# Patient Record
Sex: Male | Born: 1970 | Race: Black or African American | Hispanic: No | Marital: Married | Smoking: Current every day smoker
Health system: Southern US, Community
[De-identification: ages and names within clinical notes are randomized; demographics above are authoritative.]

## PROBLEM LIST (undated history)

## (undated) DIAGNOSIS — S3992XA Unspecified injury of lower back, initial encounter: Secondary | ICD-10-CM

## (undated) DIAGNOSIS — J189 Pneumonia, unspecified organism: Secondary | ICD-10-CM

---

## 2003-03-15 ENCOUNTER — Emergency Department (HOSPITAL_COMMUNITY): Admission: EM | Admit: 2003-03-15 | Discharge: 2003-03-15 | Payer: Self-pay | Admitting: Family Medicine

## 2006-12-23 ENCOUNTER — Emergency Department (HOSPITAL_COMMUNITY): Admission: EM | Admit: 2006-12-23 | Discharge: 2006-12-23 | Payer: Self-pay | Admitting: Emergency Medicine

## 2009-08-22 ENCOUNTER — Emergency Department (HOSPITAL_COMMUNITY): Admission: EM | Admit: 2009-08-22 | Discharge: 2009-08-22 | Payer: Self-pay | Admitting: Family Medicine

## 2010-03-23 LAB — POCT URINALYSIS DIPSTICK
Bilirubin Urine: NEGATIVE
Glucose, UA: NEGATIVE mg/dL
Nitrite: NEGATIVE

## 2010-04-23 ENCOUNTER — Inpatient Hospital Stay (INDEPENDENT_AMBULATORY_CARE_PROVIDER_SITE_OTHER)
Admission: RE | Admit: 2010-04-23 | Discharge: 2010-04-23 | Disposition: A | Discharge status: Home / Self Care | Payer: Self-pay | Source: Ambulatory Visit | Attending: Family Medicine | Admitting: Family Medicine

## 2010-04-23 DIAGNOSIS — K047 Periapical abscess without sinus: Secondary | ICD-10-CM

## 2010-06-17 ENCOUNTER — Inpatient Hospital Stay (INDEPENDENT_AMBULATORY_CARE_PROVIDER_SITE_OTHER)
Admission: RE | Admit: 2010-06-17 | Discharge: 2010-06-17 | Disposition: A | Discharge status: Home / Self Care | Payer: Self-pay | Source: Ambulatory Visit | Attending: Family Medicine | Admitting: Family Medicine

## 2010-06-17 DIAGNOSIS — K047 Periapical abscess without sinus: Secondary | ICD-10-CM

## 2010-11-27 ENCOUNTER — Emergency Department (HOSPITAL_COMMUNITY)
Admission: EM | Admit: 2010-11-27 | Discharge: 2010-11-27 | Disposition: A | Discharge status: Home / Self Care | Payer: Self-pay | Attending: Emergency Medicine | Admitting: Emergency Medicine

## 2010-11-27 ENCOUNTER — Emergency Department (HOSPITAL_COMMUNITY): Payer: Self-pay

## 2010-11-27 ENCOUNTER — Encounter: Payer: Self-pay | Admitting: Emergency Medicine

## 2010-11-27 DIAGNOSIS — M791 Myalgia, unspecified site: Secondary | ICD-10-CM

## 2010-11-27 DIAGNOSIS — J189 Pneumonia, unspecified organism: Secondary | ICD-10-CM | POA: Insufficient documentation

## 2010-11-27 DIAGNOSIS — R05 Cough: Secondary | ICD-10-CM | POA: Insufficient documentation

## 2010-11-27 DIAGNOSIS — R509 Fever, unspecified: Secondary | ICD-10-CM | POA: Insufficient documentation

## 2010-11-27 DIAGNOSIS — R059 Cough, unspecified: Secondary | ICD-10-CM | POA: Insufficient documentation

## 2010-11-27 DIAGNOSIS — R61 Generalized hyperhidrosis: Secondary | ICD-10-CM | POA: Insufficient documentation

## 2010-11-27 DIAGNOSIS — IMO0001 Reserved for inherently not codable concepts without codable children: Secondary | ICD-10-CM | POA: Insufficient documentation

## 2010-11-27 MED ORDER — AZITHROMYCIN 250 MG PO TABS: 250.0000 mg | ORAL_TABLET | Freq: Every day | ORAL | Status: AC

## 2010-11-27 MED ORDER — AZITHROMYCIN 250 MG PO TABS
500.0000 mg | ORAL_TABLET | Freq: Once | ORAL | Status: AC
  Administered 2010-11-27: 500 mg via ORAL
  Filled 2010-11-27 (×2): qty 2

## 2010-11-27 MED ORDER — HYDROCODONE-HOMATROPINE 5-1.5 MG/5ML PO SYRP: 5.0000 mL | ORAL_SOLUTION | Freq: Four times a day (QID) | ORAL | Status: AC | PRN

## 2010-11-27 NOTE — ED Notes (Signed)
Pt states he feels like he is wheezing when he coughs, body aches and chills have been constant.  Pt states he has been taking motrin, but has been unable to take temperature

## 2010-11-27 NOTE — ED Provider Notes (Signed)
Medical screening examination/treatment/procedure(s) were performed by non-physician practitioner and as supervising physician I was immediately available for consultation/collaboration.   Shylee Durrett M Edwing Figley, DO 11/27/10 1924 

## 2010-11-27 NOTE — ED Provider Notes (Signed)
History     CSN: [id number] Arrival date & time: 11/27/2010 10:28 AM   First MD Initiated Contact with Patient 11/27/10 1032      Chief Complaint  Patient presents with  . Generalized Body Aches    (Consider location/radiation/quality/duration/timing/severity/associated sxs/prior treatment) HPI Comments: Patient states he's been having flulike symptoms for the last week.  It started off with a cough that was nonproductive and changed into a productive cough.  Then a couple days later, he began having myalgias and fevers with night sweats.  He denies currently having fevers, sore throat, or rhinorrhea.  The history is provided by the patient.    History reviewed. No pertinent past medical history.  History reviewed. No pertinent past surgical history.  Family History  Problem Relation Age of Onset  . Asthma Child     History  Substance Use Topics  . Smoking status: Current Everyday Smoker  . Smokeless tobacco: Not on file  . Alcohol Use: No      Review of Systems  Constitutional: Positive for fever, chills and fatigue.  HENT: Negative for ear pain, congestion, sore throat, rhinorrhea, sneezing, neck pain, neck stiffness, sinus pressure and tinnitus.   Eyes: Negative for visual disturbance.  Respiratory: Positive for cough and chest tightness. Negative for shortness of breath and stridor.   Cardiovascular: Negative for chest pain and palpitations.  Gastrointestinal: Negative for nausea, vomiting, abdominal pain and diarrhea.  Genitourinary: Negative for dysuria.  Musculoskeletal: Positive for myalgias.  Skin: Negative for color change and rash.  Neurological: Negative for dizziness and weakness.  Hematological: Does not bruise/bleed easily.  Psychiatric/Behavioral: Negative for confusion.  All other systems reviewed and are negative.    Allergies  Review of patient's allergies indicates no known allergies.  Home Medications   Current Outpatient Rx  Name  Route Sig Dispense Refill  . IBUPROFEN 800 MG PO TABS Oral Take 800 mg by mouth every 8 (eight) hours as needed. For pain or fever       BP 130/66  Pulse 89  Temp(Src) 98.7 F (37.1 C) (Oral)  Resp 20  SpO2 98%  Physical Exam  Constitutional: He is oriented to person, place, and time. He appears well-nourished. No distress.  HENT:  Head: Normocephalic and atraumatic. No trismus in the jaw.  Right Ear: External ear normal. No drainage or tenderness. No mastoid tenderness.  Left Ear: External ear normal. No drainage or tenderness. No mastoid tenderness.  Nose: Nose normal. No rhinorrhea or sinus tenderness.  Mouth/Throat: Uvula is midline, oropharynx is clear and moist and mucous membranes are normal. No uvula swelling. No oropharyngeal exudate.  Eyes: Conjunctivae and EOM are normal. Right eye exhibits no discharge. Left eye exhibits no discharge. No scleral icterus.  Neck: Normal range of motion. Neck supple.  Cardiovascular: Normal rate, regular rhythm and normal heart sounds.   Pulmonary/Chest: Effort normal and breath sounds normal. No stridor. No respiratory distress. He has no wheezes. He exhibits tenderness.  Abdominal: Soft. There is no tenderness.  Musculoskeletal: Normal range of motion.  Neurological: He is alert and oriented to person, place, and time.  Skin: Skin is warm and dry. No rash noted. He is not diaphoretic.  Psychiatric: He has a normal mood and affect. His behavior is normal.    ED Course  Procedures (including critical care time)  Labs Reviewed - No data to display Dg Chest 2 View  11/27/2010  *RADIOLOGY REPORT*  Clinical Data: Cough and fever.  CHEST - 2 VIEW  Comparison: None.  Findings: There is asymmetric density in the left lower lung potentially in both the lingula and lower lobe.  This is suspicious for pneumonia.  Part of this density may also represent areas of mucous plugging and atelectasis.  No edema or pleural effusion. Heart size and  mediastinal contours are within normal limits.  IMPRESSION: Patchy density in the left lung suspicious for pneumonia and likely involving both the lingula and lower lobe.  Original Report Authenticated By: Reola Calkins, M.D.     No diagnosis found.    MDM  Myalgias Cough Pneumonia        Jaci Carrel, [place] 11/27/10 1140

## 2010-11-27 NOTE — ED Notes (Signed)
Body aches and flu s/s x 1 week fever also

## 2011-03-20 ENCOUNTER — Encounter (HOSPITAL_COMMUNITY): Payer: Self-pay | Admitting: Emergency Medicine

## 2011-03-20 ENCOUNTER — Emergency Department (INDEPENDENT_AMBULATORY_CARE_PROVIDER_SITE_OTHER)
Admission: EM | Admit: 2011-03-20 | Discharge: 2011-03-20 | Disposition: A | Discharge status: Home Health | Payer: Self-pay | Source: Home / Self Care | Attending: Emergency Medicine | Admitting: Emergency Medicine

## 2011-03-20 DIAGNOSIS — M545 Low back pain: Secondary | ICD-10-CM

## 2011-03-20 DIAGNOSIS — R319 Hematuria, unspecified: Secondary | ICD-10-CM

## 2011-03-20 HISTORY — DX: Pneumonia, unspecified organism: J18.9

## 2011-03-20 HISTORY — DX: Unspecified injury of lower back, initial encounter: S39.92XA

## 2011-03-20 LAB — POCT URINALYSIS DIP (DEVICE)
Bilirubin Urine: NEGATIVE
Ketones, ur: NEGATIVE mg/dL
Leukocytes, UA: NEGATIVE
Nitrite: NEGATIVE

## 2011-03-20 MED ORDER — METAXALONE 800 MG PO TABS: 800.0000 mg | ORAL_TABLET | Freq: Three times a day (TID) | ORAL | Status: DC

## 2011-03-20 MED ORDER — HYDROCODONE-ACETAMINOPHEN 5-325 MG PO TABS: 2.0000 | ORAL_TABLET | ORAL | Status: DC | PRN

## 2011-03-20 MED ORDER — IBUPROFEN 800 MG PO TABS: 800.0000 mg | ORAL_TABLET | Freq: Three times a day (TID) | ORAL | Status: AC | PRN

## 2011-03-20 NOTE — ED Notes (Signed)
PT HERE WITH RIGHT FLANK PAIN RADIATING DOWN TO LOWER BACK AND LEG INTERMITT  X 3 DYS AGO.DENIES N/V OR FEVERS.PAIN IS DULL/ACHY

## 2011-03-20 NOTE — Discharge Instructions (Signed)
Take the medication as written. Take 1 gram of tylenol with the motrin up to 4 times a day as needed for pain and fever. This is an effective combination for pain. Take the hydrocodone/norco only for severe pain. Do not take the tylenol and hydrocodone/norco as they both have tylenol in them and too much can hurt your liver. Return if you get worse, have a  fever >100.4, or for any concerns.   Go to www.goodrx.com to look up your medications. This will give you a list of where you can find your prescriptions at the most affordable prices.    RESOURCE GUIDE  Chronic Pain Problems Contact [facility]  [phone] Patients need to be referred by their primary care doctor.  Insufficient Money for Medicine Contact [employer]:  call "211" or [facility] [phone].  No Primary Care Doctor Call [facility]  [phone] Other agencies that provide inexpensive medical care    [facility]  [phone]    [facility]  [phone]    [facility]  [phone] [facility] [phone]   [address], [place] [place], [place] [address]   [facility]  [facility]     [employer]                          [facility] [address] [place]                       [address]      [address] [place] [address]   [phone] (After Hours)

## 2011-03-20 NOTE — ED Provider Notes (Signed)
History     CSN: [id number]  Arrival date & time 03/20/11  9147   First MD Initiated Contact with Patient 03/20/11 1021      Chief Complaint  Patient presents with  . Flank Pain  . Back Pain    (Consider location/radiation/quality/duration/timing/severity/associated sxs/prior treatment) HPI Comments: Patient reports 3 days of intermittent, dull, achy, right lower back pain that occasionally radiates down his right leg. Pain lasts for approximately 20 seconds-a minute, is worse with bending forward, torso rotation. No flank pain, abdominal pain. No hematuria, urgency, frequency, cloudy or oderous urine. No nausea, vomiting, fevers. Has been taking 800 mg of ibuprofen with some relief. No history of kidney stones. Patient has remote history of back injury, and gets pain in this area from "time to time". Patient moves furniture for a living, but does not recall any recent specific injuries. No unintentional weight loss, prolonged steroid use, history of IVDU.  ROS as noted in HPI. All other ROS negative.   Patient is a 41 y.o. male presenting with back pain. The history is provided by the patient. No language interpreter was used.  Back Pain  This is a recurrent problem. The current episode started more than 2 days ago. The problem has not changed since onset.The pain is associated with lifting heavy objects. The pain is present in the lumbar spine. The quality of the pain is described as aching. The symptoms are aggravated by bending, certain positions and twisting. The pain is worse during the day. Pertinent negatives include no chest pain, no fever, no numbness, no abdominal pain, no abdominal swelling, no bowel incontinence, no perianal numbness, no bladder incontinence, no dysuria, no pelvic pain, no paresthesias, no paresis and no weakness. He has tried NSAIDs for the symptoms. The treatment provided mild relief.    Past Medical History  Diagnosis Date  . PNA (pneumonia)   . Lower  back injury     History reviewed. No pertinent past surgical history.  Family History  Problem Relation Age of Onset  . Asthma Child     History  Substance Use Topics  . Smoking status: Current Everyday Smoker  . Smokeless tobacco: Not on file  . Alcohol Use: No      Review of Systems  Constitutional: Negative for fever.  Cardiovascular: Negative for chest pain.  Gastrointestinal: Negative for abdominal pain and bowel incontinence.  Genitourinary: Negative for bladder incontinence, dysuria and pelvic pain.  Musculoskeletal: Positive for back pain.  Neurological: Negative for weakness, numbness and paresthesias.    Allergies  Review of patient's allergies indicates no known allergies.  Home Medications   Current Outpatient Rx  Name Route Sig Dispense Refill  . HYDROCODONE-ACETAMINOPHEN 5-325 MG PO TABS Oral Take 2 tablets by mouth every 4 (four) hours as needed for pain. 20 tablet 0  . IBUPROFEN 800 MG PO TABS Oral Take 1 tablet (800 mg total) by mouth every 8 (eight) hours as needed for pain. For pain or fever 20 tablet 0  . METAXALONE 800 MG PO TABS Oral Take 1 tablet (800 mg total) by mouth 3 (three) times daily. 21 tablet 0    BP 131/72  Pulse 68  Temp(Src) 99.4 F (37.4 C) (Oral)  Resp 14  SpO2 100%  Physical Exam  Nursing note and vitals reviewed. Constitutional: He is oriented to person, place, and time. He appears well-developed and well-nourished.  HENT:  Head: Normocephalic and atraumatic.  Eyes: Conjunctivae and EOM are normal.  Neck: Normal range of  motion.  Cardiovascular: Normal rate, regular rhythm, normal heart sounds and intact distal pulses.   Pulmonary/Chest: Effort normal. No respiratory distress.  Abdominal: Soft. Bowel sounds are normal. He exhibits no distension. There is no tenderness. There is no rebound and no guarding.  Musculoskeletal: Normal range of motion. He exhibits no edema.       Right paralumbar tenderness. Bilateral lower  extremities nontender, baseline ROM with intact PT pulses.. No pain with PROM hips bilaterally. SLR neg bilaterally. Sensation baseline light touch bilaterally for Pt, DTR's symmetric and intact bilaterally KJ,  Motor symmetric bilateral 5/5 hip flexion, quadriceps, hamstrings, EHL, foot dorsiflexion, foot plantarflexion, gait normal.    Neurological: He is alert and oriented to person, place, and time.  Skin: Skin is warm and dry.  Psychiatric: He has a normal mood and affect. His behavior is normal.    ED Course  Procedures (including critical care time)  Labs Reviewed  POCT URINALYSIS DIP (DEVICE) - Abnormal; Notable for the following:    Hgb urine dipstick SMALL (*)    Protein, ur 30 (*)    All other components within normal limits   No results found.   1. Low back pain   2. Hematuria      Results for orders placed during the hospital encounter of 03/20/11  POCT URINALYSIS DIP (DEVICE)      Component Value Range   Glucose, UA NEGATIVE  NEGATIVE (mg/dL)   Bilirubin Urine NEGATIVE  NEGATIVE    Ketones, ur NEGATIVE  NEGATIVE (mg/dL)   Specific Gravity, Urine 1.025  1.005 - 1.030    Hgb urine dipstick SMALL (*) NEGATIVE    pH 6.5  5.0 - 8.0    Protein, ur 30 (*) NEGATIVE (mg/dL)   Urobilinogen, UA 1.0  0.0 - 1.0 (mg/dL)   Nitrite NEGATIVE  NEGATIVE    Leukocytes, UA NEGATIVE  NEGATIVE     MDM  Previous charts reviewed. Additional medical history obtained. As noted in history of present illness. Patient was seen here previously in 2001 for similar presentation. Had trace hematuria at that time. Discussed today's hematuria with patient. No urinary complaints. The history and physical is more consistent with musculoskeletal cause of his pain, rather than a kidney stone. Advised him of the risk of bladder cancer, especially with his smoking. The patient will follow up with PMD of his choice.  Domenick Gong, MD 03/20/11 1137

## 2011-03-22 ENCOUNTER — Emergency Department (HOSPITAL_COMMUNITY)
Admission: EM | Admit: 2011-03-22 | Discharge: 2011-03-22 | Disposition: A | Discharge status: Home / Self Care | Payer: Self-pay | Attending: Emergency Medicine | Admitting: Emergency Medicine

## 2011-03-22 ENCOUNTER — Encounter (HOSPITAL_COMMUNITY): Payer: Self-pay | Admitting: Emergency Medicine

## 2011-03-22 DIAGNOSIS — F17200 Nicotine dependence, unspecified, uncomplicated: Secondary | ICD-10-CM | POA: Insufficient documentation

## 2011-03-22 DIAGNOSIS — M549 Dorsalgia, unspecified: Secondary | ICD-10-CM | POA: Insufficient documentation

## 2011-03-22 LAB — URINALYSIS, ROUTINE W REFLEX MICROSCOPIC
Ketones, ur: NEGATIVE mg/dL
Leukocytes, UA: NEGATIVE
Nitrite: NEGATIVE
Specific Gravity, Urine: 1.026 (ref 1.005–1.030)
Urobilinogen, UA: 1 mg/dL (ref 0.0–1.0)
pH: 8 (ref 5.0–8.0)

## 2011-03-22 LAB — URINE MICROSCOPIC-ADD ON

## 2011-03-22 NOTE — ED Provider Notes (Signed)
History     CSN: [id number]  Arrival date & time 03/22/11  1432   First MD Initiated Contact with Patient 03/22/11 1506      Chief Complaint  Patient presents with  . Back Pain    (Consider location/radiation/quality/duration/timing/severity/associated sxs/prior treatment) HPI History provided by pt and prior chart.  Pt presents w/ right side and low back pain w/ radiation down entire right leg x 1 week.   Pain waxes and wanes but severity can be as great as 6/10 on pain scale.  No modifying factors.  Has taken skelaxin and ibuprofen w/out relief.  Denies fever, bladder/bowel dysfunction and lower extremity weakness/parasthesias.   No known trauma, h/o kidney stone or h/o chronic low back pain.  Per prior chart, pt seen at [facility] for same 2 days ago.  Urine dip stick showed small amt of hemoglobin but was negative for infection.  Pain thought to be musculoskeletal and pt d/c'd home w/ pain medication.    Past Medical History  Diagnosis Date  . PNA (pneumonia)   . Lower back injury     History reviewed. No pertinent past surgical history.  Family History  Problem Relation Age of Onset  . Asthma Child     History  Substance Use Topics  . Smoking status: Current Everyday Smoker  . Smokeless tobacco: Not on file  . Alcohol Use: No      Review of Systems  All other systems reviewed and are negative.    Allergies  Review of patient's allergies indicates no known allergies.  Home Medications   Current Outpatient Rx  Name Route Sig Dispense Refill  . IBUPROFEN 800 MG PO TABS Oral Take 1 tablet (800 mg total) by mouth every 8 (eight) hours as needed for pain. For pain or fever 20 tablet 0    BP 116/63  Pulse 70  Temp 98.7 F (37.1 C)  Resp 16  SpO2 99%  Physical Exam  Nursing note and vitals reviewed. Constitutional: He is oriented to person, place, and time. He appears well-developed and well-nourished.  HENT:  Head: Normocephalic and atraumatic.  Eyes:   Normal appearance  Neck: Normal range of motion.  Cardiovascular: Normal rate and regular rhythm.   Pulmonary/Chest: Effort normal and breath sounds normal.  Abdominal: Soft. Bowel sounds are normal. He exhibits no distension.       Mild tenderness of right side only.  No skin changes in this location.  Musculoskeletal:       Lumbar spinal and paraspinal non-tender. Mild R CVA ttp. Full ROM of LE.  Nml patellar reflexes.  No saddle anesthesia. Distal sensation intact.  2+ DP pulses.  Ambulates w/out diffulty.   Neurological: He is alert and oriented to person, place, and time.  Skin: Skin is warm and dry. No rash noted.  Psychiatric: He has a normal mood and affect. His behavior is normal.    ED Course  Procedures (including critical care time)  Labs Reviewed  URINALYSIS, ROUTINE W REFLEX MICROSCOPIC - Abnormal; Notable for the following:    APPearance TURBID (*)    All other components within normal limits  URINE MICROSCOPIC-ADD ON   No results found.   1. Back pain       MDM  Healthy 41yo M presents w/ non-traumatic right side pain w/ radiation down RLE x 1 week.  No associated sx.  No back pain red flag s/sx.  Pt does not appear to be uncomfortable and U/A w/out infection or hematuria.  Doubt kidney stone.  Pain likely muscular or radicular.  Discussed this w/ pt.  He has a prescription for ibuprofen, skelaxin and hydrocodone at home.  Recommended ice and rest and refer to ortho for persistent sx.  Return precautions discussed.        Otilio Miu, [place] 03/22/11 1726

## 2011-03-22 NOTE — ED Notes (Signed)
Was [facility] few [facility] and was told he had  A little blood in ua has not been able to get into his dr

## 2011-03-22 NOTE — Discharge Instructions (Signed)
Take your vicodin and skelaxin as prescribed.  Do not drive within four hours of taking these medications (may cause drowsiness or confusion).  Take ibuprofen w/ food up to three times a day, as well.  Apply ice to painful areas 2-3 times a day.  Avoid activities that aggravate pain.  Follow up w/ Dr. Rennis Chris if pain has not started to improve by Monday.  Back Exercises Back exercises help treat and prevent back injuries. The goal of back exercises is to increase the strength of your abdominal and back muscles and the flexibility of your back. These exercises should be started when you no longer have back pain. Back exercises include:  Pelvic Tilt. Lie on your back with your knees bent. Tilt your pelvis until the lower part of your back is against the floor. Hold this position 5 to 10 sec and repeat 5 to 10 times.   Knee to Chest. Pull first 1 knee up against your chest and hold for 20 to 30 seconds, repeat this with the other knee, and then both knees. This may be done with the other leg straight or bent, whichever feels better.   Sit-Ups or Curl-Ups. Bend your knees 90 degrees. Start with tilting your pelvis, and do a partial, slow sit-up, lifting your trunk only 30 to 45 degrees off the floor. Take at least 2 to 3 seconds for each sit-up. Do not do sit-ups with your knees out straight. If partial sit-ups are difficult, simply do the above but with only tightening your abdominal muscles and holding it as directed.   Hip-Lift. Lie on your back with your knees flexed 90 degrees. Push down with your feet and shoulders as you raise your hips a couple inches off the floor; hold for 10 seconds, repeat 5 to 10 times.   Back arches. Lie on your stomach, propping yourself up on bent elbows. Slowly press on your hands, causing an arch in your low back. Repeat 3 to 5 times. Any initial stiffness and discomfort should lessen with repetition over time.   Shoulder-Lifts. Lie face down with arms beside your body.  Keep hips and torso pressed to floor as you slowly lift your head and shoulders off the floor.  Do not overdo your exercises, especially in the beginning. Exercises may cause you some mild back discomfort which lasts for a few minutes; however, if the pain is more severe, or lasts for more than 15 minutes, do not continue exercises until you see your caregiver. Improvement with exercise therapy for back problems is slow.  See your caregivers for assistance with developing a proper back exercise program. Document Released: 02/01/2004 Document Revised: 12/13/2010 Document Reviewed: 12/24/2004 [facility] Patient Information 2012 [place], [place].Call [facility] [phone]) if you do not have a primary care doctor and would like assistance with finding one.   You should return to the ER if you develop fever, leg weakness or loss of control of bladder/bowels.

## 2011-03-23 NOTE — ED Provider Notes (Signed)
Medical screening examination/treatment/procedure(s) were performed by non-physician practitioner and as supervising physician I was immediately available for consultation/collaboration.  Flint Melter, MD 03/23/11 [phone]

## 2013-04-14 ENCOUNTER — Encounter (HOSPITAL_COMMUNITY): Payer: Self-pay | Admitting: Emergency Medicine

## 2013-04-14 ENCOUNTER — Emergency Department (HOSPITAL_COMMUNITY)
Admission: EM | Admit: 2013-04-14 | Discharge: 2013-04-14 | Disposition: A | Discharge status: Home / Self Care | Payer: BC Managed Care – PPO | Source: Home / Self Care | Attending: Emergency Medicine | Admitting: Emergency Medicine

## 2013-04-14 DIAGNOSIS — J00 Acute nasopharyngitis [common cold]: Secondary | ICD-10-CM

## 2013-04-14 NOTE — Discharge Instructions (Signed)
You have a common cold. Plenty of fluids, tylenol as directed on packaging for aches/pain. Delsym as directed on packaging for cough. Try to stop smoking.   Upper Respiratory Infection, Adult An upper respiratory infection (URI) is also sometimes known as the common cold. The upper respiratory tract includes the nose, sinuses, throat, trachea, and bronchi. Bronchi are the airways leading to the lungs. Most people improve within 1 week, but symptoms can last up to 2 weeks. A residual cough may last even longer.  CAUSES Many different viruses can infect the tissues lining the upper respiratory tract. The tissues become irritated and inflamed and often become very moist. Mucus production is also common. A cold is contagious. You can easily spread the virus to others by oral contact. This includes kissing, sharing a glass, coughing, or sneezing. Touching your mouth or nose and then touching a surface, which is then touched by another person, can also spread the virus. SYMPTOMS  Symptoms typically develop 1 to 3 days after you come in contact with a cold virus. Symptoms vary from person to person. They may include:  Runny nose.  Sneezing.  Nasal congestion.  Sinus irritation.  Sore throat.  Loss of voice (laryngitis).  Cough.  Fatigue.  Muscle aches.  Loss of appetite.  Headache.  Low-grade fever. DIAGNOSIS  You might diagnose your own cold based on familiar symptoms, since most people get a cold 2 to 3 times a year. Your caregiver can confirm this based on your exam. Most importantly, your caregiver can check that your symptoms are not due to another disease such as strep throat, sinusitis, pneumonia, asthma, or epiglottitis. Blood tests, throat tests, and X-rays are not necessary to diagnose a common cold, but they may sometimes be helpful in excluding other more serious diseases. Your caregiver will decide if any further tests are required. RISKS AND COMPLICATIONS  You may be at risk  for a more severe case of the common cold if you smoke cigarettes, have chronic heart disease (such as heart failure) or lung disease (such as asthma), or if you have a weakened immune system. The very young and very old are also at risk for more serious infections. Bacterial sinusitis, middle ear infections, and bacterial pneumonia can complicate the common cold. The common cold can worsen asthma and chronic obstructive pulmonary disease (COPD). Sometimes, these complications can require emergency medical care and may be life-threatening. PREVENTION  The best way to protect against getting a cold is to practice good hygiene. Avoid oral or hand contact with people with cold symptoms. Wash your hands often if contact occurs. There is no clear evidence that vitamin C, vitamin E, echinacea, or exercise reduces the chance of developing a cold. However, it is always recommended to get plenty of rest and practice good nutrition. TREATMENT  Treatment is directed at relieving symptoms. There is no cure. Antibiotics are not effective, because the infection is caused by a virus, not by bacteria. Treatment may include:  Increased fluid intake. Sports drinks offer valuable electrolytes, sugars, and fluids.  Breathing heated mist or steam (vaporizer or shower).  Eating chicken soup or other clear broths, and maintaining good nutrition.  Getting plenty of rest.  Using gargles or lozenges for comfort.  Controlling fevers with ibuprofen or acetaminophen as directed by your caregiver.  Increasing usage of your inhaler if you have asthma. Zinc gel and zinc lozenges, taken in the first 24 hours of the common cold, can shorten the duration and lessen the severity  of symptoms. Pain medicines may help with fever, muscle aches, and throat pain. A variety of non-prescription medicines are available to treat congestion and runny nose. Your caregiver can make recommendations and may suggest nasal or lung inhalers for other  symptoms.  HOME CARE INSTRUCTIONS   Only take over-the-counter or prescription medicines for pain, discomfort, or fever as directed by your caregiver.  Use a warm mist humidifier or inhale steam from a shower to increase air moisture. This may keep secretions moist and make it easier to breathe.  Drink enough water and fluids to keep your urine clear or pale yellow.  Rest as needed.  Return to work when your temperature has returned to normal or as your caregiver advises. You may need to stay home longer to avoid infecting others. You can also use a face mask and careful hand washing to prevent spread of the virus. SEEK MEDICAL CARE IF:   After the first few days, you feel you are getting worse rather than better.  You need your caregiver's advice about medicines to control symptoms.  You develop chills, worsening shortness of breath, or brown or red sputum. These may be signs of pneumonia.  You develop yellow or brown nasal discharge or pain in the face, especially when you bend forward. These may be signs of sinusitis.  You develop a fever, swollen neck glands, pain with swallowing, or white areas in the back of your throat. These may be signs of strep throat. SEEK IMMEDIATE MEDICAL CARE IF:   You have a fever.  You develop severe or persistent headache, ear pain, sinus pain, or chest pain.  You develop wheezing, a prolonged cough, cough up blood, or have a change in your usual mucus (if you have chronic lung disease).  You develop sore muscles or a stiff neck. Document Released: 06/19/2000 Document Revised: 03/18/2011 Document Reviewed: 04/27/2010 [facility] Patient Information 2014 [place], [place].

## 2013-04-14 NOTE — ED Notes (Signed)
Pt reports  Symptoms  Of  Cough  /  Congested   Stuffy  Nose   And    shorttness  Of  bnreath   When he   Takes  A  Deep  Breath         He reports    His  Throat  Feels  scrathchy          Pt  Is  Sitting  Upright on the  Exam table  Speaking in  Complete  sentances  And    Appears  In no acute  Distress

## 2013-04-14 NOTE — ED Provider Notes (Signed)
CSN: [id number]     Arrival date & time 04/14/13  1002 History   First MD Initiated Contact with Patient 04/14/13 1105     Chief Complaint  Patient presents with  . URI   (Consider location/radiation/quality/duration/timing/severity/associated sxs/prior Treatment) HPI Comments: No chest pain or fever.  Is a smoker PCP: none Works at [employer]  Patient is a 43 y.o. male presenting with URI. The history is provided by the patient.  URI Presenting symptoms: congestion, cough and rhinorrhea   Presenting symptoms: no fever and no sore throat   Severity:  Mild Onset quality:  Gradual Duration:  3 days Timing:  Constant Progression:  Unchanged Chronicity:  New Associated symptoms: no arthralgias, no headaches, no myalgias, no neck pain, no sinus pain, no sneezing, no swollen glands and no wheezing     Past Medical History  Diagnosis Date  . PNA (pneumonia)   . Lower back injury    History reviewed. No pertinent past surgical history. Family History  Problem Relation Age of Onset  . Asthma Child    History  Substance Use Topics  . Smoking status: Current Every Day Smoker  . Smokeless tobacco: Not on file  . Alcohol Use: No    Review of Systems  Constitutional: Negative for fever.  HENT: Positive for congestion and rhinorrhea. Negative for sneezing and sore throat.   Respiratory: Positive for cough. Negative for wheezing.   Musculoskeletal: Negative for arthralgias, myalgias and neck pain.  Neurological: Negative for headaches.  All other systems reviewed and are negative.   Allergies  Review of patient's allergies indicates no known allergies.  Home Medications   Current Outpatient Rx  Name  Route  Sig  Dispense  Refill  . ibuprofen (ADVIL,MOTRIN) 800 MG tablet   Oral   Take 1 tablet (800 mg total) by mouth every 8 (eight) hours as needed for pain. For pain or fever   20 tablet   0    BP 120/69  Pulse 75  Temp(Src) 98.5 F (36.9 C) (Oral)  Resp 16  SpO2  100% Physical Exam  Nursing note and vitals reviewed. Constitutional: He is oriented to person, place, and time. He appears well-developed and well-nourished. No distress.  HENT:  Head: Normocephalic and atraumatic.  Right Ear: Hearing, tympanic membrane, external ear and ear canal normal.  Left Ear: Hearing, tympanic membrane, external ear and ear canal normal.  Nose: Nose normal.  Mouth/Throat: Uvula is midline, oropharynx is clear and moist and mucous membranes are normal.  Eyes: Conjunctivae are normal. No scleral icterus.  Neck: Normal range of motion. Neck supple.  Cardiovascular: Normal rate, regular rhythm and normal heart sounds.   Pulmonary/Chest: Effort normal and breath sounds normal.  Abdominal: Soft. Bowel sounds are normal. He exhibits no distension. There is no tenderness.  Musculoskeletal: Normal range of motion.  Lymphadenopathy:    He has no cervical adenopathy.  Neurological: He is alert and oriented to person, place, and time.  Skin: Skin is warm and dry. No rash noted.  Psychiatric: He has a normal mood and affect. His behavior is normal.    ED Course  Procedures (including critical care time) Labs Review Labs Reviewed - No data to display Imaging Review No results found.   MDM   1. Common cold    Patient appears well. Will advise regarding symptomatic care at home or self limited common cold. Encouraged to stop smoking.   Jess BartersJennifer Lee GreshamPresson, [place] 04/14/13 1124

## 2013-04-15 ENCOUNTER — Emergency Department (HOSPITAL_COMMUNITY)
Admission: EM | Admit: 2013-04-15 | Discharge: 2013-04-15 | Disposition: A | Discharge status: Home / Self Care | Payer: BC Managed Care – PPO | Attending: Emergency Medicine | Admitting: Emergency Medicine

## 2013-04-15 ENCOUNTER — Emergency Department (HOSPITAL_COMMUNITY): Payer: BC Managed Care – PPO

## 2013-04-15 ENCOUNTER — Encounter (HOSPITAL_COMMUNITY): Payer: Self-pay | Admitting: Emergency Medicine

## 2013-04-15 DIAGNOSIS — Z8701 Personal history of pneumonia (recurrent): Secondary | ICD-10-CM | POA: Insufficient documentation

## 2013-04-15 DIAGNOSIS — Z87828 Personal history of other (healed) physical injury and trauma: Secondary | ICD-10-CM | POA: Insufficient documentation

## 2013-04-15 DIAGNOSIS — J069 Acute upper respiratory infection, unspecified: Secondary | ICD-10-CM

## 2013-04-15 DIAGNOSIS — F17200 Nicotine dependence, unspecified, uncomplicated: Secondary | ICD-10-CM | POA: Insufficient documentation

## 2013-04-15 MED ORDER — NAPROXEN 500 MG PO TABS: 500.0000 mg | ORAL_TABLET | Freq: Two times a day (BID) | ORAL | Status: AC

## 2013-04-15 MED ORDER — BENZONATATE 100 MG PO CAPS: 100.0000 mg | ORAL_CAPSULE | Freq: Three times a day (TID) | ORAL | Status: AC

## 2013-04-15 MED ORDER — HYDROCODONE-HOMATROPINE 5-1.5 MG/5ML PO SYRP: 5.0000 mL | ORAL_SOLUTION | Freq: Four times a day (QID) | ORAL | Status: AC | PRN

## 2013-04-15 NOTE — Discharge Instructions (Signed)
Take 400-600 mg Ibuprofen (Motrin) every 6-8 hours for fever and pain  Alternate with Tylenol  Take  500 mg Tylenol every 4-6 hours as needed for fever and pain  Follow-up with your primary care provider next week for recheck of symptoms if not improving.  Be sure to drink plenty of fluids and rest, at least 8hrs of sleep a night, preferably more while you are sick. Return to the ED if you cannot keep down fluids/signs of dehydration, fever not reducing with Tylenol, difficulty breathing/wheezing, stiff neck, worsening condition, or other concerns (see below)    Cough, Adult  A cough is a reflex. It helps you clear your throat and airways. A cough can help heal your body. A cough can last 2 or 3 weeks (acute) or may last more than 8 weeks (chronic). Some common causes of a cough can include an infection, allergy, or a cold. HOME CARE  Only take medicine as told by your doctor.  If given, take your medicines (antibiotics) as told. Finish them even if you start to feel better.  Use a cold steam vaporizer or humidier in your home. This can help loosen thick spit (secretions).  Sleep so you are almost sitting up (semi-upright). Use pillows to do this. This helps reduce coughing.  Rest as needed.  Stop smoking if you smoke. GET HELP RIGHT AWAY IF:  You have yellowish-white fluid (pus) in your thick spit.  Your cough gets worse.  Your medicine does not reduce coughing, and you are losing sleep.  You cough up blood.  You have trouble breathing.  Your pain gets worse and medicine does not help.  You have a fever. MAKE SURE YOU:   Understand these instructions.  Will watch your condition.  Will get help right away if you are not doing well or get worse. Document Released: 09/06/2010 Document Revised: 03/18/2011 Document Reviewed: 09/06/2010 [facility] Patient Information 2014 [place], [place].    Emergency Department Resource Guide 1) Find a Doctor and Pay Out of  Pocket Although you won't have to find out who is covered by your insurance plan, it is a good idea to ask around and get recommendations. You will then need to call the office and see if the doctor you have chosen will accept you as a new patient and what types of options they offer for patients who are self-pay. Some doctors offer discounts or will set up payment plans for their patients who do not have insurance, but you will need to ask so you aren't surprised when you get to your appointment.  2) Contact Your Local Health Department Not all health departments have doctors that can see patients for sick visits, but many do, so it is worth a call to see if yours does. If you don't know where your local health department is, you can check in your phone book. The CDC also has a tool to help you locate your state's health department, and many state websites also have listings of all of their local health departments.  3) Find a Walk-in Clinic If your illness is not likely to be very severe or complicated, you may want to try a walk in clinic. These are popping up all over the country in pharmacies, drugstores, and shopping centers. They're usually staffed by nurse practitioners or physician assistants that have been trained to treat common illnesses and complaints. They're usually fairly quick and inexpensive. However, if you have serious medical issues or chronic medical problems, these are probably  not your best option.  No Primary Care Doctor: - Call Health Connect at  [phone] - they can help you locate a primary care doctor that  accepts your insurance, provides certain services, etc. - Physician Referral Service- [phone]  Chronic Pain Problems: Organization         Address  Phone   Notes  [facility]  [phone] Patients need to be referred by their primary care doctor.   Medication Assistance: Organization         Address  Phone   Notes  [facility]  Medication [facility] [address] [place], [place] [address] [phone] --Must be a resident of [facility] -- Must have NO insurance coverage whatsoever (no Medicaid/ Medicare, etc.) -- The pt. MUST have a primary care doctor that directs their care regularly and follows them in the community   MedAssist  [phone]   [employer]  [phone]    Agencies that provide inexpensive medical care: Organization         Address                                                       Phone                                                                            Notes  [facility]  [phone]   [facility]    [phone]   [facility] [address] [place], [place] [address] [phone]   [facility] [address], [place] [phone]   [facility]    [phone]   [facility]    [phone]   [facility]  [address], [place] Phone:  [phone], Fax:  [phone] Hours of Operation:  9 am - 6 pm, M-F.  Also accepts Medicaid/Medicare and self-pay.  [facility]  [address],  Phone: [phone], Fax: [phone]. Hours of Operation:  8:30 am - 5:30 pm, M-F.  Also accepts Medicaid and self-pay.  [facility] [place] [address], [place] Phone: [phone]   [facility] [address], [place], [place] [phone], Ext. [phone] Mondays & Thursdays: 7-9 AM.  First 15 patients are seen on a first come, first serve basis.    Medicaid-accepting [facility] Providers:  Surveyor, quantityrganization         Address                                                                       Phone  Notes  [facility] [address], [place] [phone] Also accepts self-pay patients.  [facility] [id number] [place], [place]  [phone]   [address], [address], [place] [phone]   [facility] [address] [place] [phone]   Lucianne Lei [address], [place]   [phone] Only accepts [place] Access [place] patients after they have their name applied to their card.   Self-Pay (no insurance) in [facility]:   Organization         Address                                                     Phone               Notes  Sickle Cell Patients, [facility] [place] [phone]   [facility] [place] [phone]   [facility] [place]  [place], [place], [place] [phone]   [facility]/Dr. Osei-Bonsu  [address] [place] or [address], [place] [phone] Phone number for both [place] and [place] locations is the same.  [facility]gent Medical and [facility] [address] [place] [phone]   [facility] [address] [place] or [address] [phone]   [facility] [address] [place] [phone], phone; [phone], fax Sees patients 1st and 3rd Saturday of every month.  Must not qualify for public or private insurance (i.e. Medicaid, Medicare, [place] Health Choice, Veterans' Benefits)  Household income should be no more than 200% of the poverty level The clinic cannot treat you if you are pregnant or think you are pregnant  Sexually transmitted diseases are not treated at the clinic.    Dental Care: Organization         Address                                  Phone                       Notes  [facility] Department of [facility] [phone] Accepts  children up to age 25 who are enrolled in [place] or [place]; pregnant women with a Medicaid card; and children who have applied for Medicaid or [place] Health Choice, but were declined, whose parents can pay a reduced fee at time of service.  [facility] Department of [facility]  [address], [place] [phone] Accepts children up to age 58 who are enrolled in [place] or [place]; pregnant women with a Medicaid card; and children who have applied for Medicaid or [place] Health Choice, but were declined, whose parents can pay a reduced fee at time of service.  [facility]  [place] [phone] Patients are seen by appointment only. Walk-ins are not accepted. [place] will see patients  30 years of age and older. Monday - Tuesday (8am-5pm) Most Wednesdays (8:30-5pm) $30 per visit, cash only  [facility] Adult Dental Access PROGRAM  [address], [facility] [phone] Patients are seen by appointment only. Walk-ins are not accepted. [facility] will see patients 27 years of age and older. One Wednesday Evening (Monthly: Volunteer Based).  $30 per visit, cash only  [employer]  [phone] for adults; Children under age 52, call Graduate Pediatric Dentistry at [phone]. Children aged 17-14, please call [phone] to request a pediatric application.  Dental services are provided in all areas of dental care including fillings, crowns and bridges, complete and partial dentures, implants, gum treatment, root canals, and extractions. Preventive care is also provided. Treatment is provided to both adults and children. Patients are selected via a lottery and there is often a waiting list.   [facility] [address], [place]  [phone] [email or web address]   [facility] [address] [place], [place] [phone], Ext. [phone] Second and  Fourth Thursday of each month, opens at 6:30 AM; Clinic ends at 9 AM.  Patients are seen on a first-come first-served basis, and a limited number are seen during each clinic.   [facility]  [address] [place], [place] [phone]   Eligibility Requirements You must have lived in [place], [place], or [place] counties for at least the last three months.   You cannot be eligible for state or federal sponsored [place], including [employer], [place], or [employer].   You generally cannot be eligible for healthcare insurance through your employer.    How to apply: Eligibility screenings are held every Tuesday and Wednesday afternoon from 1:00 pm until 4:00 pm. You do not need an appointment for the interview!  [facility] [address] [place], [place] [phone]   [facility] Health Department  [phone]   [facility]  [phone]   [facility] Health Department  [phone]

## 2013-04-15 NOTE — ED Provider Notes (Signed)
Medical screening examination/treatment/procedure(s) were performed by non-physician practitioner and as supervising physician I was immediately available for consultation/collaboration.  Sydny Schnitzler, M.D.  Rody Keadle C Orvill Coulthard, MD 04/15/13 0758 

## 2013-04-15 NOTE — ED Provider Notes (Signed)
CSN: [id number]     Arrival date & time 04/15/13  1023 History   This chart was scribed for Junius Finner, PA-C, working with Flint Melter, MD by Blanchard Kelch, ED Scribe. This patient was seen in room TR09C/TR09C and the patient's care was started at 12:48 PM.     Chief Complaint  Patient presents with  . Cough      Patient is a 43 y.o. male presenting with cough. The history is provided by the patient. No language interpreter was used.  Cough Associated symptoms: myalgias (chest wall)   Associated symptoms: no fever     HPI Comments: Mike Rosales is a 43 y.o. male who presents to the Emergency Department complaining of a constant, unchanged productive cough that began four days ago. He states the cough is worst at night. He has associated right chest wall tenderness when coughing. He has been using ibuprofen and Delsym without relief. He denies fever, nausea, vomiting, diarrhea. He was seen in an UC yesterday and was diagnosed with a common cold but denies relief since being seen. He denies sick contacts. He smokes about a half pack a day.   Pt denies having a PCP currently.   Past Medical History  Diagnosis Date  . PNA (pneumonia)   . Lower back injury    History reviewed. No pertinent past surgical history. Family History  Problem Relation Age of Onset  . Asthma Child    History  Substance Use Topics  . Smoking status: Current Every Day Smoker  . Smokeless tobacco: Not on file  . Alcohol Use: No    Review of Systems  Constitutional: Negative for fever.  Respiratory: Positive for cough.   Gastrointestinal: Negative for nausea, vomiting and diarrhea.  Musculoskeletal: Positive for myalgias (chest wall).  All other systems reviewed and are negative.     Allergies  Review of patient's allergies indicates no known allergies.  Home Medications   Current Outpatient Rx  Name  Route  Sig  Dispense  Refill  . ibuprofen (ADVIL,MOTRIN) 800 MG tablet   Oral    Take 1 tablet (800 mg total) by mouth every 8 (eight) hours as needed for pain. For pain or fever   20 tablet   0   . benzonatate (TESSALON) 100 MG capsule   Oral   Take 1 capsule (100 mg total) by mouth every 8 (eight) hours.   21 capsule   0   . HYDROcodone-homatropine (HYCODAN) 5-1.5 MG/5ML syrup   Oral   Take 5 mLs by mouth every 6 (six) hours as needed for cough.   100 mL   0   . naproxen (NAPROSYN) 500 MG tablet   Oral   Take 1 tablet (500 mg total) by mouth 2 (two) times daily.   30 tablet   0    Triage Vitals: BP 124/78  Pulse 65  Temp(Src) 98.4 F (36.9 C) (Oral)  Resp 18  Ht 5\' 11"  (1.803 m)  Wt 145 lb (65.772 kg)  BMI 20.23 kg/m2  SpO2 100%  Physical Exam  Nursing note and vitals reviewed. Constitutional: He appears well-developed and well-nourished.  HENT:  Head: Normocephalic and atraumatic.  Right Ear: Tympanic membrane and ear canal normal.  Left Ear: Tympanic membrane and ear canal normal.  Nose: Nose normal.  Mouth/Throat: Oropharynx is clear and moist. No oropharyngeal exudate.  Eyes: Conjunctivae are normal. No scleral icterus.  Neck: Normal range of motion.  Cardiovascular: Normal rate, regular rhythm and normal heart  sounds.   Pulmonary/Chest: Effort normal and breath sounds normal. No respiratory distress. He has no wheezes. He has no rales. He exhibits no tenderness.  Abdominal: Soft. Bowel sounds are normal. He exhibits no distension and no mass. There is no tenderness. There is no rebound and no guarding.  Musculoskeletal: Normal range of motion.  Neurological: He is alert.  Skin: Skin is warm and dry.    ED Course  Procedures (including critical care time)  DIAGNOSTIC STUDIES: Oxygen Saturation is 100% on room air, normal by my interpretation.    COORDINATION OF CARE: 12:50 PM -Discussed chest x-ray results with patient. Recommend fluids. Will discharge with prescription for cough syrup to help sleep at night. Will provide resource  guide. Patient verbalizes understanding and agrees with treatment plan.    Labs Review Labs Reviewed - No data to display Imaging Review Dg Chest 2 View (if Patient Has Fever And/or Copd)  04/15/2013   CLINICAL DATA:  Cough, congestion  EXAM: CHEST  2 VIEW  COMPARISON:  DG CHEST 2 VIEW dated 11/27/2010  FINDINGS: The heart size and mediastinal contours are within normal limits. Both lungs are clear. The visualized skeletal structures are unremarkable.  IMPRESSION: No active cardiopulmonary disease.   Electronically Signed   By: Elige KoHetal  Patel   On: 04/15/2013 12:37     EKG Interpretation None      MDM   Final diagnoses:  URI, acute    Pt c/o cough and congestion. Pt appears well, Vitals: unremarkable.  Not concerned for ACS or PE. No evidence of pneumonia on CXR. Will tx symptomatically. Encouraged rest and fluids. Rx: naproxen, hycodan, and tessalon. Work note provided. Return precautions provided. Pt verbalized understanding and agreement with tx plan.   I personally performed the services described in this documentation, which was scribed in my presence. The recorded information has been reviewed and is accurate.   Junius Finnerrin O'Malley, PA-C 04/16/13 1056

## 2013-04-15 NOTE — ED Notes (Signed)
PT states he has had a cough for past 3 days. Went to urgent care yesterday and was told it was a cold and it would go away. PT reports some soreness in R side and under shoulder blades when coughing and states green sputum when coughing at night. Some episodes of feeling hot/cold, but no confirmed fevers

## 2013-04-15 NOTE — ED Notes (Addendum)
[facility] but continues to have a dry cough with pain and hes worried because hes had pneumonia in the past. He denies fevers.

## 2013-04-17 NOTE — ED Provider Notes (Signed)
Medical screening examination/treatment/procedure(s) were performed by non-physician practitioner and as supervising physician I was immediately available for consultation/collaboration.   EKG Interpretation None       Ana Woodroof L Juanmiguel Defelice, MD 04/17/13 0912 

## 2015-01-02 ENCOUNTER — Emergency Department (HOSPITAL_COMMUNITY)
Admission: EM | Admit: 2015-01-02 | Discharge: 2015-01-02 | Disposition: A | Discharge status: Home / Self Care | Payer: Self-pay | Attending: Emergency Medicine | Admitting: Emergency Medicine

## 2015-01-02 ENCOUNTER — Encounter (HOSPITAL_COMMUNITY): Payer: Self-pay | Admitting: Emergency Medicine

## 2015-01-02 DIAGNOSIS — F17200 Nicotine dependence, unspecified, uncomplicated: Secondary | ICD-10-CM | POA: Insufficient documentation

## 2015-01-02 DIAGNOSIS — Z8701 Personal history of pneumonia (recurrent): Secondary | ICD-10-CM | POA: Insufficient documentation

## 2015-01-02 DIAGNOSIS — K047 Periapical abscess without sinus: Secondary | ICD-10-CM | POA: Insufficient documentation

## 2015-01-02 MED ORDER — HYDROCODONE-ACETAMINOPHEN 5-325 MG PO TABS: 2.0000 | ORAL_TABLET | Freq: Once | ORAL | Status: DC

## 2015-01-02 MED ORDER — PENICILLIN V POTASSIUM 500 MG PO TABS: 500.0000 mg | ORAL_TABLET | Freq: Four times a day (QID) | ORAL | Status: AC

## 2015-01-02 MED ORDER — IBUPROFEN 800 MG PO TABS
800.0000 mg | ORAL_TABLET | Freq: Once | ORAL | Status: AC
  Administered 2015-01-02: 800 mg via ORAL
  Filled 2015-01-02: qty 1

## 2015-01-02 MED ORDER — PENICILLIN V POTASSIUM 250 MG PO TABS
500.0000 mg | ORAL_TABLET | Freq: Once | ORAL | Status: AC
  Administered 2015-01-02: 500 mg via ORAL
  Filled 2015-01-02: qty 2

## 2015-01-02 MED ORDER — HYDROCODONE-ACETAMINOPHEN 5-325 MG PO TABS: 2.0000 | ORAL_TABLET | Freq: Two times a day (BID) | ORAL | Status: AC | PRN

## 2015-01-02 NOTE — Discharge Instructions (Signed)
Dental Care and Dentist Visits Mike Rosales, your tooth has an infection around it. Take antibiotics as directed. You need to see a dentist within 3 days to have your teeth pulled. For any worsening symptoms come back to the emergency department immediately. Thank you. Dental care supports good overall health. Regular dental visits can also help you avoid dental pain, bleeding, infection, and other more serious health problems in the future. It is important to keep the mouth healthy because diseases in the teeth, gums, and other oral tissues can spread to other areas of the body. Some problems, such as diabetes, heart disease, and pre-term labor have been associated with poor oral health.  See your dentist every 6 months. If you experience emergency problems such as a toothache or broken tooth, go to the dentist right away. If you see your dentist regularly, you may catch problems early. It is easier to be treated for problems in the early stages.  WHAT TO EXPECT AT A DENTIST VISIT  Your dentist will look for many common oral health problems and recommend proper treatment. At your regular dental visit, you can expect:  Gentle cleaning of the teeth and gums. This includes scraping and polishing. This helps to remove the sticky substance around the teeth and gums (plaque). Plaque forms in the mouth shortly after eating. Over time, plaque hardens on the teeth as tartar. If tartar is not removed regularly, it can cause problems. Cleaning also helps remove stains.  Periodic X-rays. These pictures of the teeth and supporting bone will help your dentist assess the health of your teeth.  Periodic fluoride treatments. Fluoride is a natural mineral shown to help strengthen teeth. Fluoride treatmentinvolves applying a fluoride gel or varnish to the teeth. It is most commonly done in children.  Examination of the mouth, tongue, jaws, teeth, and gums to look for any oral health problems, such as:  Cavities (dental  caries). This is decay on the tooth caused by plaque, sugar, and acid in the mouth. It is best to catch a cavity when it is small.  Inflammation of the gums caused by plaque buildup (gingivitis).  Problems with the mouth or malformed or misaligned teeth.  Oral cancer or other diseases of the soft tissues or jaws. KEEP YOUR TEETH AND GUMS HEALTHY For healthy teeth and gums, follow these general guidelines as well as your dentist's specific advice:  Have your teeth professionally cleaned at the dentist every 6 months.  Brush twice daily with a fluoride toothpaste.  Floss your teeth daily.  Ask your dentist if you need fluoride supplements, treatments, or fluoride toothpaste.  Eat a healthy diet. Reduce foods and drinks with added sugar.  Avoid smoking. TREATMENT FOR ORAL HEALTH PROBLEMS If you have oral health problems, treatment varies depending on the conditions present in your teeth and gums.  Your caregiver will most likely recommend good oral hygiene at each visit.  For cavities, gingivitis, or other oral health disease, your caregiver will perform a procedure to treat the problem. This is typically done at a separate appointment. Sometimes your caregiver will refer you to another dental specialist for specific tooth problems or for surgery. SEEK IMMEDIATE DENTAL CARE IF:  You have pain, bleeding, or soreness in the gum, tooth, jaw, or mouth area.  A permanent tooth becomes loose or separated from the gum socket.  You experience a blow or injury to the mouth or jaw area.   This information is not intended to replace advice given to you  by your health care provider. Make sure you discuss any questions you have with your health care provider.   Document Released: 09/05/2010 Document Revised: 03/18/2011 Document Reviewed: 09/05/2010 Elsevier Interactive Patient Education [employer]2016 [employer]

## 2015-01-02 NOTE — ED Notes (Signed)
R upper dental pain with swelling since yesterday.

## 2015-01-02 NOTE — ED Provider Notes (Signed)
CSN: [id number]     Arrival date & time 01/02/15  0454 History   First MD Initiated Contact with Patient 01/02/15 [phone]     Chief Complaint  Patient presents with  . Dental Pain     (Consider location/radiation/quality/duration/timing/severity/associated sxs/prior Treatment) HPI  Mike Rosales is a 44 y.o. male with no significant past medical history presenting today with right upper dental pain. He states he's had pain in his tooth for several weeks have reassured the pain increased and he developed swelling which goes up to his eyelid. His concern for abscess. Denies fevers. He has had problems with multiple teeth and had teeth pulled in the past. He states he has a chipped tooth as well. Patient is not currently having a dentist. He took Excedrin with no relief. There are no further concerns.  10 Systems reviewed and are negative for acute change except as noted in the HPI.     Past Medical History  Diagnosis Date  . PNA (pneumonia)   . Lower back injury    History reviewed. No pertinent past surgical history. Family History  Problem Relation Age of Onset  . Asthma Child    Social History  Substance Use Topics  . Smoking status: Current Every Day Smoker  . Smokeless tobacco: None  . Alcohol Use: No    Review of Systems    Allergies  Review of patient's allergies indicates no known allergies.  Home Medications   Prior to Admission medications   Medication Sig Start Date End Date Taking? Authorizing Provider  benzonatate (TESSALON) 100 MG capsule Take 1 capsule (100 mg total) by mouth every 8 (eight) hours. 04/15/13   Junius FinnerErin O'Malley, PA-C  HYDROcodone-homatropine (HYCODAN) 5-1.5 MG/5ML syrup Take 5 mLs by mouth every 6 (six) hours as needed for cough. 04/15/13   Junius FinnerErin O'Malley, PA-C  ibuprofen (ADVIL,MOTRIN) 800 MG tablet Take 1 tablet (800 mg total) by mouth every 8 (eight) hours as needed for pain. For pain or fever 03/20/11   Domenick GongAshley Mortenson, MD  naproxen (NAPROSYN)  500 MG tablet Take 1 tablet (500 mg total) by mouth 2 (two) times daily. 04/15/13   Junius FinnerErin O'Malley, PA-C   BP 117/79 mmHg  Pulse 61  Temp(Src) 98.3 F (36.8 C) (Oral)  Resp 16  SpO2 100% Physical Exam  Constitutional: He is oriented to person, place, and time. Vital signs are normal. He appears well-developed and well-nourished.  Non-toxic appearance. He does not appear ill. No distress.  HENT:  Head: Normocephalic and atraumatic.  Nose: Nose normal.  Mouth/Throat: Oropharynx is clear and moist. No oropharyngeal exudate.  Overall poor dentition. Chipped tooth and significant decay seen over tooth #14. This is the source of his pain. There is swelling noted in the gumline extending up to the soft tissues of the face. No fluctuant abscess seen.  Eyes: Conjunctivae and EOM are normal. Pupils are equal, round, and reactive to light. No scleral icterus.  Neck: Normal range of motion. Neck supple. No tracheal deviation, no edema, no erythema and normal range of motion present. No thyroid mass and no thyromegaly present.  Cardiovascular: Normal rate, regular rhythm, S1 normal, S2 normal, normal heart sounds, intact distal pulses and normal pulses.  Exam reveals no gallop and no friction rub.   No murmur heard. Pulmonary/Chest: Effort normal and breath sounds normal. No respiratory distress. He has no wheezes. He has no rhonchi. He has no rales.  Abdominal: Soft. Normal appearance and bowel sounds are normal. He exhibits no distension,  no ascites and no mass. There is no hepatosplenomegaly. There is no tenderness. There is no rebound, no guarding and no CVA tenderness.  Musculoskeletal: Normal range of motion. He exhibits no edema or tenderness.  Lymphadenopathy:    He has no cervical adenopathy.  Neurological: He is alert and oriented to person, place, and time. He has normal strength. No cranial nerve deficit or sensory deficit.  Skin: Skin is warm, dry and intact. No petechiae and no rash noted. He  is not diaphoretic. No erythema. No pallor.  Psychiatric: He has a normal mood and affect. His behavior is normal. Judgment normal.  Nursing note and vitals reviewed.   ED Course  Procedures (including critical care time) Labs Review Labs Reviewed - No data to display  Imaging Review No results found. I have personally reviewed and evaluated these images and lab results as part of my medical decision-making.   EKG Interpretation None      MDM   Final diagnoses:  None    Patient presents emergency department for dental pain. I do see evidence of swelling on exam plus I will give narcotic pain medication and penicillin for treatment. To follow-up provided. He appears well in no acute distress, vital signs remained within his normal limits and he is safe for discharge.  Tomasita Crumble, MD 01/02/15 [phone]

## 2015-07-02 IMAGING — CR DG CHEST 2V
2 series · 2 of 2 positions shown · non-contrast
Comparison: DG CHEST 2 VIEW dated 11/27/2010

CLINICAL DATA: Cough, congestion

EXAM:
CHEST  2 VIEW

[w chest pa]
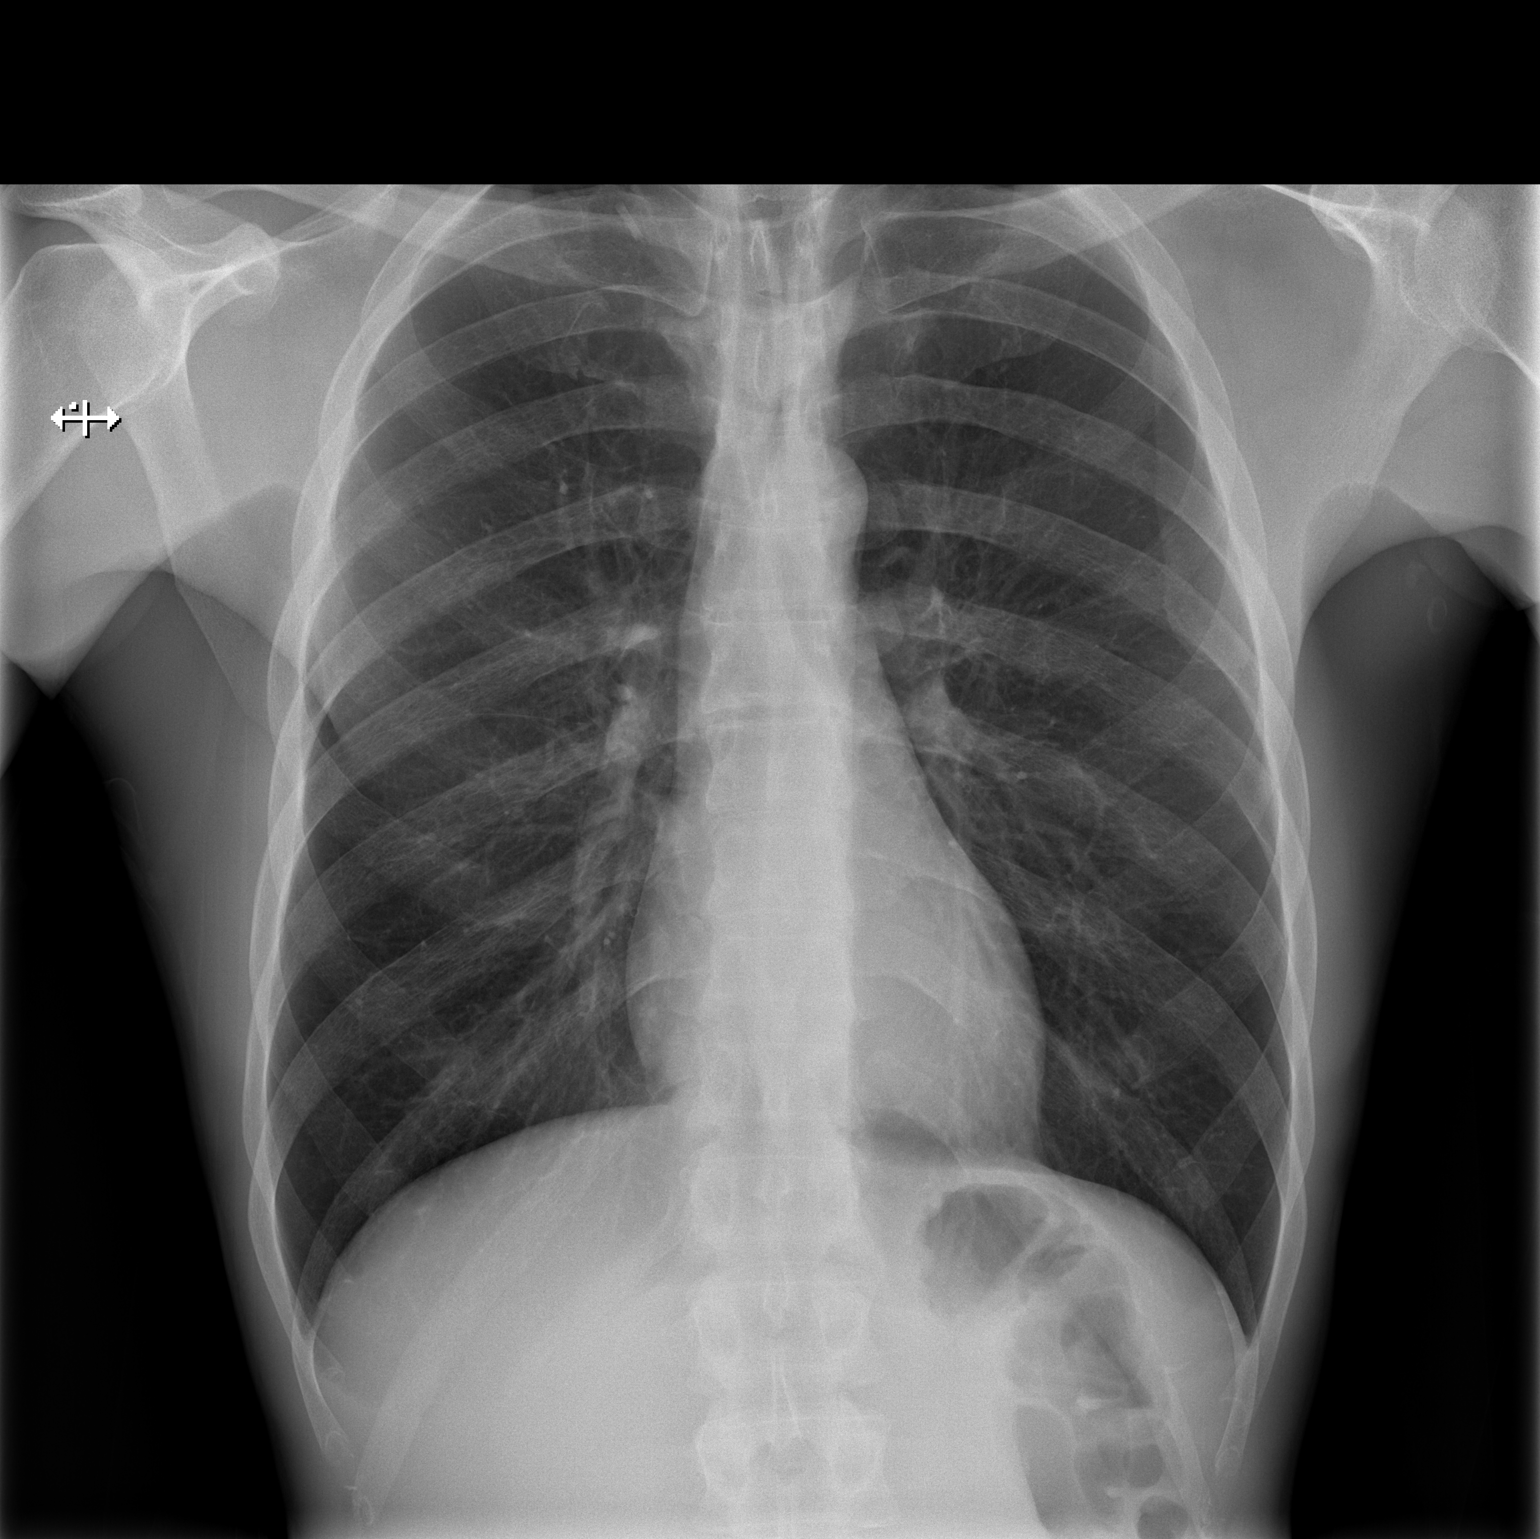

[w chest lat]
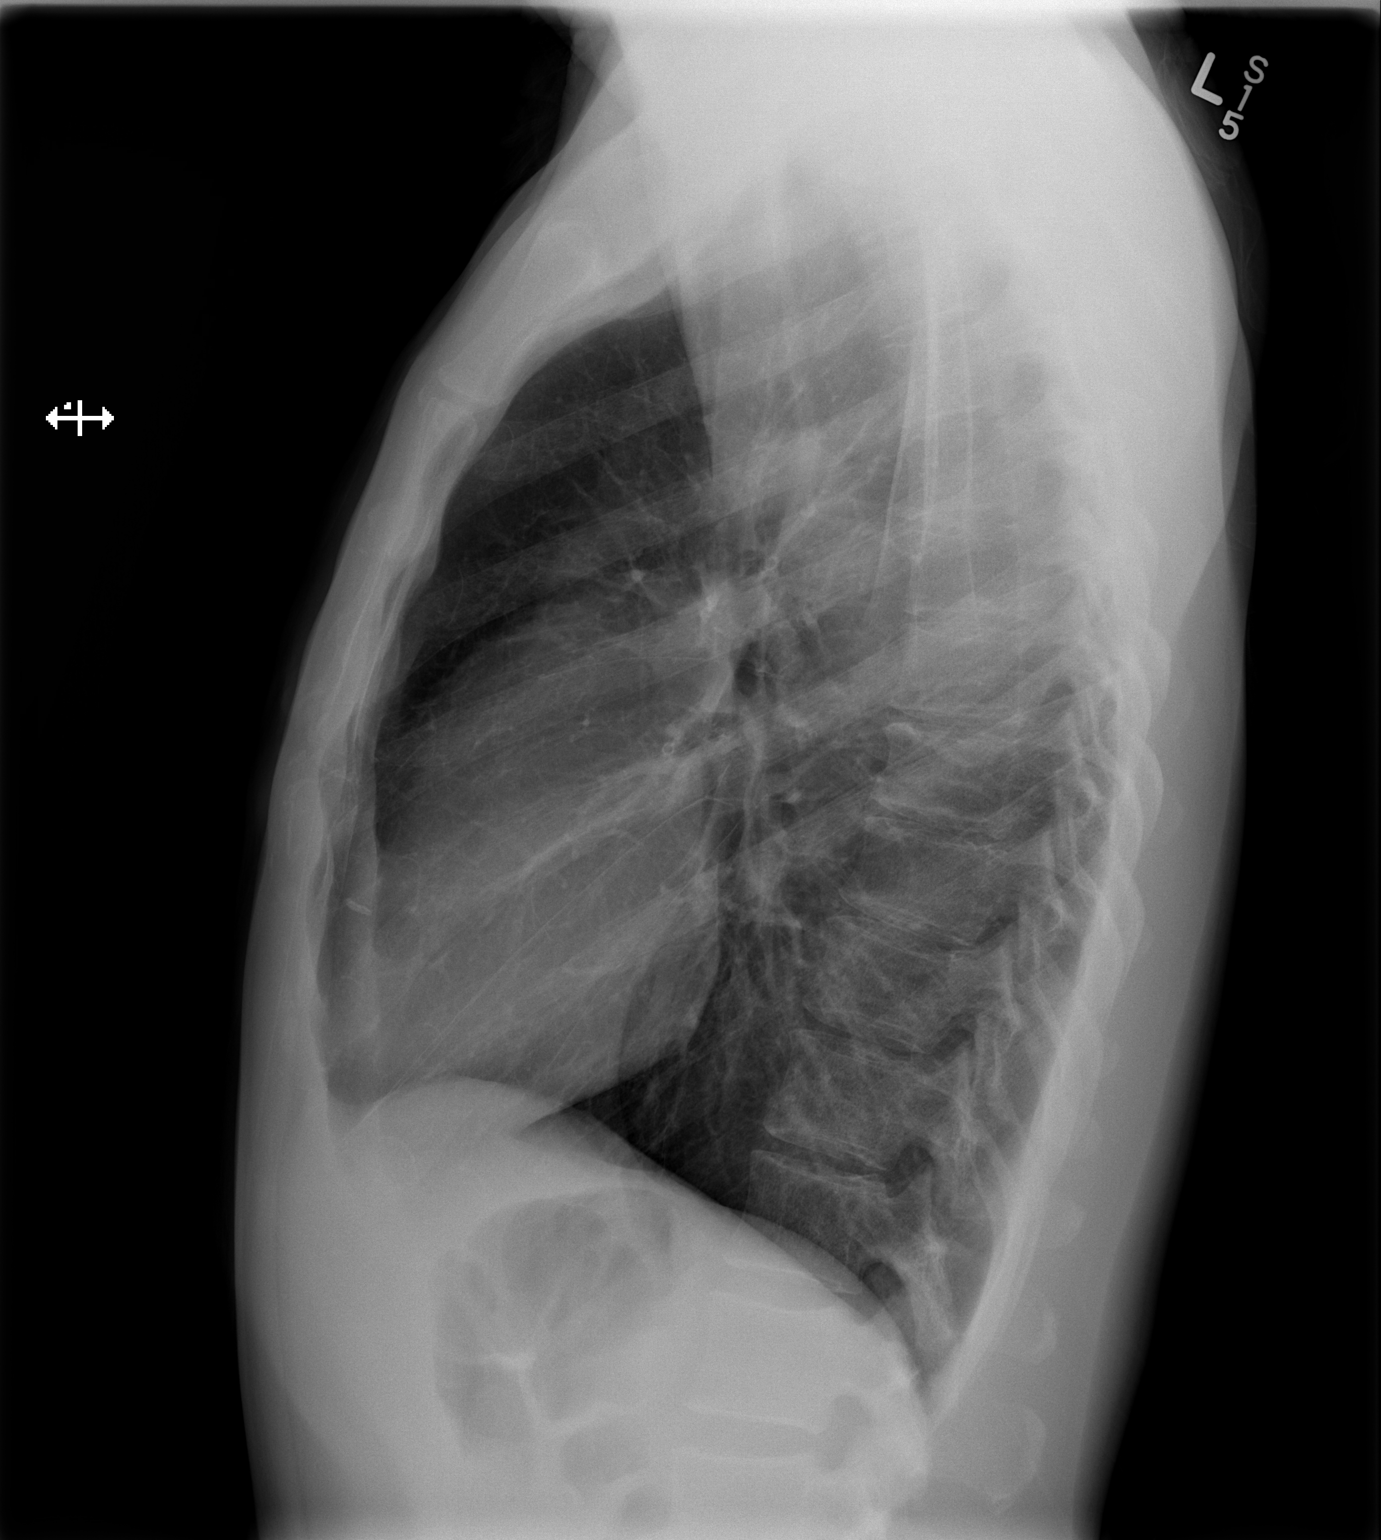

[2 of 2 positions shown; findings below may reference images not displayed]

FINDINGS: The heart size and mediastinal contours are within normal limits.
Both lungs are clear. The visualized skeletal structures are
unremarkable.
IMPRESSION: No active cardiopulmonary disease.

## 2018-02-13 ENCOUNTER — Ambulatory Visit: Payer: Self-pay | Admitting: Family Medicine
